# Patient Record
Sex: Male | Born: 2000 | Race: Black or African American | Hispanic: No | Marital: Single
Health system: Southern US, Community
[De-identification: ages and names within clinical notes are randomized; demographics above are authoritative.]

## PROBLEM LIST (undated history)

## (undated) DIAGNOSIS — L309 Dermatitis, unspecified: Secondary | ICD-10-CM

---
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
## 2021-05-10 ENCOUNTER — Other Ambulatory Visit: Payer: Self-pay

## 2021-05-10 ENCOUNTER — Ambulatory Visit (HOSPITAL_COMMUNITY)
Admission: EM | Admit: 2021-05-10 | Discharge: 2021-05-10 | Disposition: A | Discharge status: Home / Self Care | Payer: Medicaid Other | Attending: Internal Medicine | Admitting: Internal Medicine

## 2021-05-10 ENCOUNTER — Encounter (HOSPITAL_COMMUNITY): Payer: Self-pay | Admitting: Emergency Medicine

## 2021-05-10 DIAGNOSIS — S61412A Laceration without foreign body of left hand, initial encounter: Secondary | ICD-10-CM

## 2021-05-10 MED ORDER — LIDOCAINE-EPINEPHRINE 1 %-1:100000 IJ SOLN
INTRAMUSCULAR | Status: AC
  Filled 2021-05-10: qty 1

## 2021-05-10 MED ORDER — MUPIROCIN CALCIUM 2 % EX CREA: 1.0000 "application " | TOPICAL_CREAM | Freq: Two times a day (BID) | CUTANEOUS | 0 refills | Status: AC

## 2021-05-10 NOTE — Discharge Instructions (Addendum)
Daily wound dressing changes with antibiotic ointment Okay to wash with soap and water 72 hours after laceration repair If you notice worsening pain, swelling, redness-please return to urgent care to be reevaluated. Return to urgent care in 7 to 10 days for suture removal

## 2021-05-10 NOTE — ED Provider Notes (Signed)
[facility]    CSN: [id number] Arrival date & time: 05/10/21  1324      History   Chief Complaint Chief Complaint  Patient presents with   Extremity Laceration    HPI Christopher Yang is a 20 y.o. male comes to urgent care with laceration in the left hand.  This happened few hours ago.  Bleeding is controlled.  No numbness or tingling in the fingers.  Patient has full use of the fingers.  No bruising in the hand.Marland Kitchen   HPI  Past Medical History:  Diagnosis Date   Asthma    Eczema     Patient Active Problem List   Diagnosis Date Noted   ALLERGIC RHINITIS 11/06/2009   HEADACHE 11/06/2009   ATTENTION DEFICIT HYPERACTIVITY DISORDER 03/06/2009   ECZEMA 03/06/2009   ABNORMAL RENAL ULTRASOUND 12/26/2007    History reviewed. No pertinent surgical history.     Home Medications    Prior to Admission medications   Medication Sig Start Date End Date Taking? Authorizing Provider  mupirocin cream (BACTROBAN) 2 % Apply 1 application topically 2 (two) times daily. 05/10/21  Yes Edsel Shives, Britta Mccreedy, MD  amphetamine-dextroamphetamine (ADDERALL XR) 15 MG 24 hr capsule Take 15 mg by mouth every morning.  01/19/19  [provider]  cetirizine (ZYRTEC) 5 MG tablet Take 5 mg by mouth daily.  01/19/19  [provider]    Family History Family History  Family history unknown: Yes    Social History     Allergies   Patient has no known allergies.   Review of Systems Review of Systems  Constitutional: Negative.   Skin:  Positive for wound. Negative for color change.    Physical Exam Triage Vital Signs ED Triage Vitals  Enc Vitals Group     BP 05/10/21 1559 137/85     Pulse Rate 05/10/21 1559 65     Resp 05/10/21 1559 16     Temp --      Temp src --      SpO2 05/10/21 1559 100 %     Weight --      Height --      Head Circumference --      Peak Flow --      Pain Score 05/10/21 1555 3     Pain Loc --      Pain Edu? --      Excl. in GC? --     No data found.  Updated Vital Signs BP 137/85 (BP Location: Right Arm)   Pulse 65   Resp 16   SpO2 100%   Visual Acuity Right Eye Distance:   Left Eye Distance:   Bilateral Distance:    Right Eye Near:   Left Eye Near:    Bilateral Near:     Physical Exam Vitals and nursing note reviewed.  Constitutional:      General: He is not in acute distress.    Appearance: He is not ill-appearing.  Cardiovascular:     Rate and Rhythm: Normal rate and regular rhythm.  Musculoskeletal:        General: No swelling or tenderness. Normal range of motion.  Skin:    General: Skin is warm.     Comments: 1 inch laceration in the palmar aspect of the left hand.  Neurological:     Mental Status: He is alert.     UC Treatments / Results  Labs (all labs ordered are listed, but only abnormal results are displayed)  Labs Reviewed - No data to display  EKG   Radiology No results found.  Procedures Laceration Repair  Date/Time: 05/10/2021 5:06 PM Performed by: Merrilee Jansky, MD Authorized by: Merrilee Jansky, MD   Consent:    Consent obtained:  Verbal   Consent given by:  Patient   Risks discussed:  Infection and pain Universal protocol:    Patient identity confirmed:  Verbally with patient Anesthesia:    Anesthesia method:  Local infiltration   Local anesthetic:  Lidocaine 2% WITH epi Laceration details:    Location:  Hand   Hand location:  L palm   Length (cm):  2   Depth (mm):  5 Pre-procedure details:    Preparation:  Patient was prepped and draped in usual sterile fashion Exploration:    Imaging outcome: foreign body noted   Treatment:    Area cleansed with:  Shur-Clens   Amount of cleaning:  Standard   Debridement:  None Skin repair:    Repair method:  Sutures   Suture size:  3-0   Suture material:  Prolene   Suture technique:  Simple interrupted   Number of sutures:  3 Approximation:    Approximation:  Close Post-procedure details:    Dressing:   Antibiotic ointment   Procedure completion:  Tolerated well, no immediate complications (including critical care time)  Medications Ordered in UC Medications - No data to display  Initial Impression / Assessment and Plan / UC Course  I have reviewed the triage vital signs and the nursing notes.  Pertinent labs & imaging results that were available during my care of the patient were reviewed by me and considered in my medical decision making (see chart for details).     Laceration of the left hand: Laceration repair completed Daily wound dressing changes with antibiotic ointment Tetanus shot given Return to urgent care in 7 to 10 days for suture removal Final Clinical Impressions(s) / UC Diagnoses   Final diagnoses:  Laceration of left hand without foreign body, initial encounter     Discharge Instructions      Daily wound dressing changes with antibiotic ointment Okay to wash with soap and water 72 hours after laceration repair If you notice worsening pain, swelling, redness-please return to urgent care to be reevaluated. Return to urgent care in 7 to 10 days for suture removal   ED Prescriptions     Medication Sig Dispense Auth. Provider   mupirocin cream (BACTROBAN) 2 % Apply 1 application topically 2 (two) times daily. 15 g Jahziah Simonin, Britta Mccreedy, MD      PDMP not reviewed this encounter.   Merrilee Jansky, MD 05/10/21 1710
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
## 2021-05-25 ENCOUNTER — Ambulatory Visit (HOSPITAL_COMMUNITY): Payer: Self-pay
